# Patient Record
Sex: Male | Born: 1990 | Race: White | Hispanic: No | Marital: Married | Smoking: Never smoker
Health system: Southern US, Community
[De-identification: ages and names within clinical notes are randomized; demographics above are authoritative.]

---

## 2017-02-19 ENCOUNTER — Ambulatory Visit (INDEPENDENT_AMBULATORY_CARE_PROVIDER_SITE_OTHER): Payer: 59

## 2017-02-19 ENCOUNTER — Encounter (HOSPITAL_COMMUNITY): Payer: Self-pay | Admitting: Emergency Medicine

## 2017-02-19 ENCOUNTER — Ambulatory Visit (HOSPITAL_COMMUNITY)
Admission: EM | Admit: 2017-02-19 | Discharge: 2017-02-19 | Disposition: A | Discharge status: Home / Self Care | Payer: 59 | Attending: Family Medicine | Admitting: Family Medicine

## 2017-02-19 DIAGNOSIS — S9001XA Contusion of right ankle, initial encounter: Secondary | ICD-10-CM | POA: Diagnosis not present

## 2017-02-19 DIAGNOSIS — M25571 Pain in right ankle and joints of right foot: Secondary | ICD-10-CM | POA: Diagnosis not present

## 2017-02-19 MED ORDER — HYDROCODONE-ACETAMINOPHEN 5-325 MG PO TABS: 1.0000 | ORAL_TABLET | Freq: Four times a day (QID) | ORAL | 0 refills | Status: AC | PRN

## 2017-02-19 NOTE — ED Triage Notes (Signed)
The patient presented to the [facility] with a complaint of a right foot injury. The patient stated that a large log rolled onto his foot earlier today.

## 2017-02-19 NOTE — ED Provider Notes (Signed)
[facility]    CSN: [id number] Arrival date & time: 02/19/17  1921     History   Chief Complaint Chief Complaint  Patient presents with  . Foot Pain    HPI Luke Shepherd is a 26 y.o. male.   The patient presented to the [facility] with a complaint of a right lower extremity injury. The patient stated that a large log rolled onto his foot earlier today. The log rolled down an embankment and crushed his ankle, pinning in to the ground and requiring another adult to extricate the right lower extremity.  No ankle or foot pain.  Asian arrived wearing a cam walker.      History reviewed. No pertinent past medical history.  There are no active problems to display for this patient.   History reviewed. No pertinent surgical history.     Home Medications    Prior to Admission medications   Medication Sig Start Date End Date Taking? Authorizing Provider  HYDROcodone-acetaminophen (NORCO) 5-325 MG tablet Take 1 tablet by mouth every 6 (six) hours as needed for moderate pain. 02/19/17   Elvina Sidle, MD    Family History History reviewed. No pertinent family history.  Social History Social History  Substance Use Topics  . Smoking status: Never Smoker  . Smokeless tobacco: Never Used  . Alcohol use No     Allergies   Penicillins   Review of Systems Review of Systems  Musculoskeletal: Positive for gait problem and joint swelling.  All other systems reviewed and are negative.    Physical Exam Triage Vital Signs ED Triage Vitals  Enc Vitals Group     BP      Pulse      Resp      Temp      Temp src      SpO2      Weight      Height      Head Circumference      Peak Flow      Pain Score      Pain Loc      Pain Edu?      Excl. in GC?    No data found.   Updated Vital Signs BP 124/78 (BP Location: Right Arm)   Pulse 85   Temp 98.5 F (36.9 C) (Oral)   Resp 18   SpO2 98%    Physical Exam  Constitutional: He is oriented to person, place,  and time. He appears well-developed and well-nourished.  HENT:  Right Ear: External ear normal.  Left Ear: External ear normal.  Mouth/Throat: Oropharynx is clear and moist.  Eyes: Conjunctivae are normal. Pupils are equal, round, and reactive to light.  Neck: Normal range of motion. Neck supple.  Pulmonary/Chest: Effort normal.  Musculoskeletal:  Swollen, reddened ankle with tenderness both malleoli  Neurological: He is alert and oriented to person, place, and time.  Skin: Skin is warm and dry.  Nursing note and vitals reviewed.    UC Treatments / Results  Labs (all labs ordered are listed, but only abnormal results are displayed) Labs Reviewed - No data to display  EKG  EKG Interpretation None       Radiology No results found.  Procedures Procedures (including critical care time)  Medications Ordered in UC Medications - No data to display   Initial Impression / Assessment and Plan / UC Course  I have reviewed the triage vital signs and the nursing notes.  Pertinent labs & imaging results that  were available during my care of the patient were reviewed by me and considered in my medical decision making (see chart for details).     Final Clinical Impressions(s) / UC Diagnoses   Final diagnoses:  Contusion of right ankle, initial encounter    New Prescriptions New Prescriptions   HYDROCODONE-ACETAMINOPHEN (NORCO) 5-325 MG TABLET    Take 1 tablet by mouth every 6 (six) hours as needed for moderate pain.     Elvina Sidle, MD 02/19/17 2041

## 2017-02-19 NOTE — Discharge Instructions (Signed)
Continue using the Cam Walker brace that you wore to the clinic.

## 2017-11-14 DIAGNOSIS — W312XXA Contact with powered woodworking and forming machines, initial encounter: Secondary | ICD-10-CM | POA: Diagnosis not present

## 2017-11-14 DIAGNOSIS — S61310A Laceration without foreign body of right index finger with damage to nail, initial encounter: Secondary | ICD-10-CM | POA: Diagnosis not present

## 2017-11-14 DIAGNOSIS — Z23 Encounter for immunization: Secondary | ICD-10-CM | POA: Diagnosis not present

## 2017-11-14 DIAGNOSIS — S61213A Laceration without foreign body of left middle finger without damage to nail, initial encounter: Secondary | ICD-10-CM | POA: Diagnosis not present

## 2017-11-14 DIAGNOSIS — Z880 Allergy status to penicillin: Secondary | ICD-10-CM | POA: Diagnosis not present

## 2017-11-14 DIAGNOSIS — Y92009 Unspecified place in unspecified non-institutional (private) residence as the place of occurrence of the external cause: Secondary | ICD-10-CM | POA: Diagnosis not present

## 2018-03-25 IMAGING — DX DG ANKLE COMPLETE 3+V*R*
3 series · 3 of 3 positions shown · non-contrast
Comparison: None.

CLINICAL DATA: Question injury to right ankle. Right ankle pain and
bruising. Initial encounter.

EXAM:
RIGHT ANKLE - COMPLETE 3+ VIEW

[ankle ap]
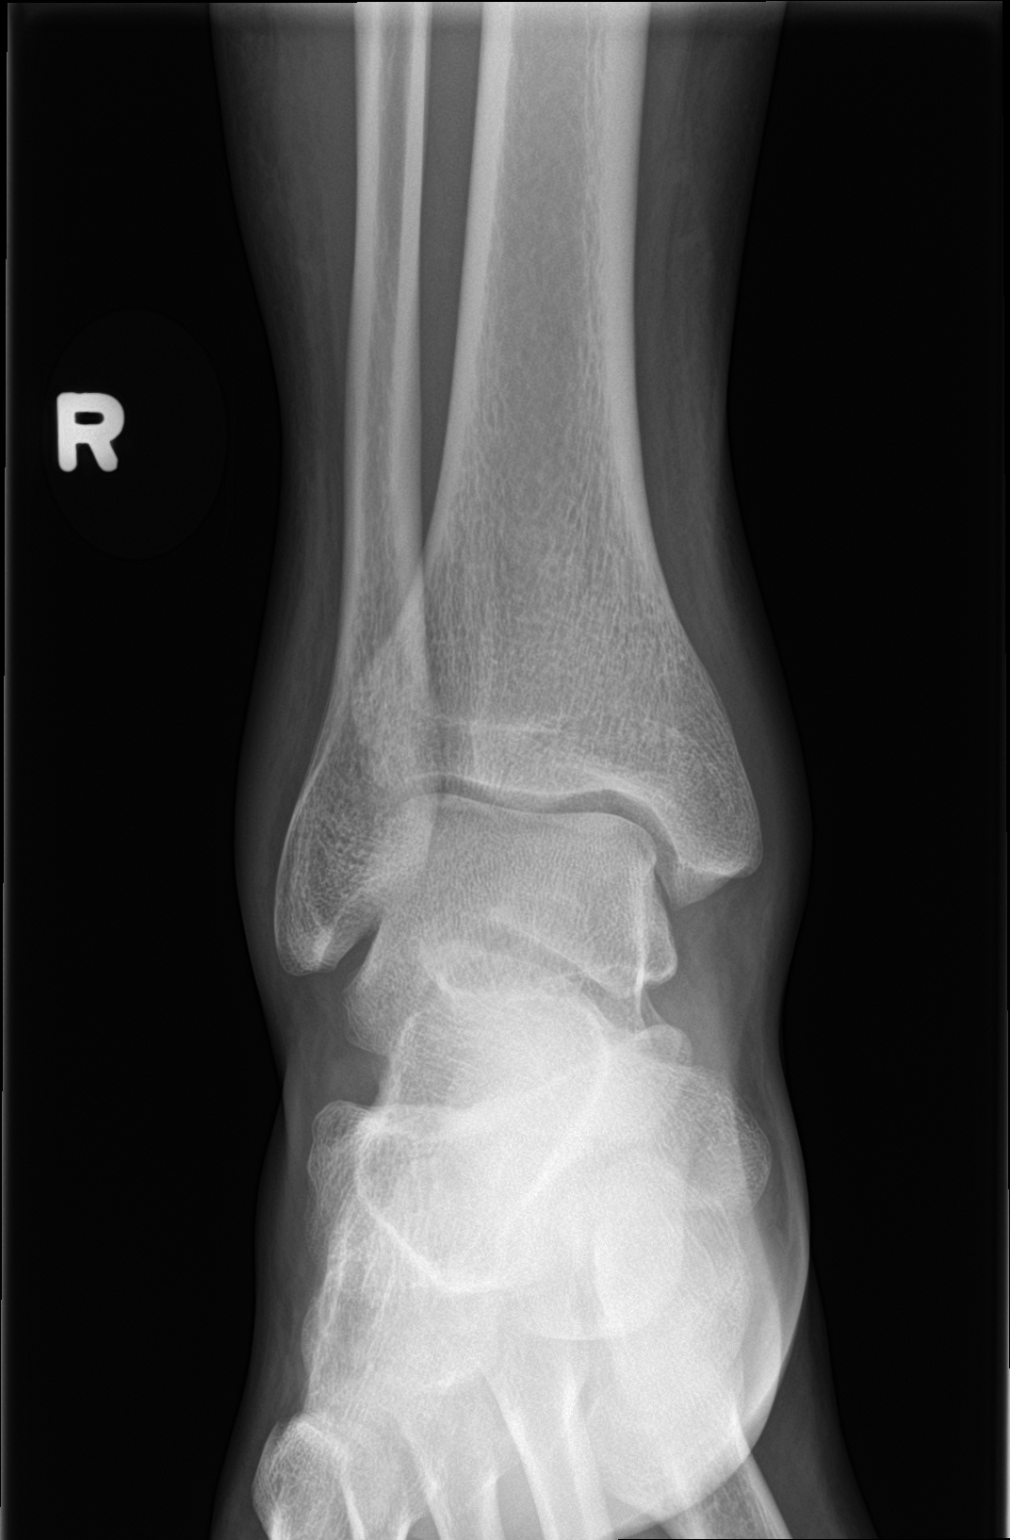

[ankle obl]
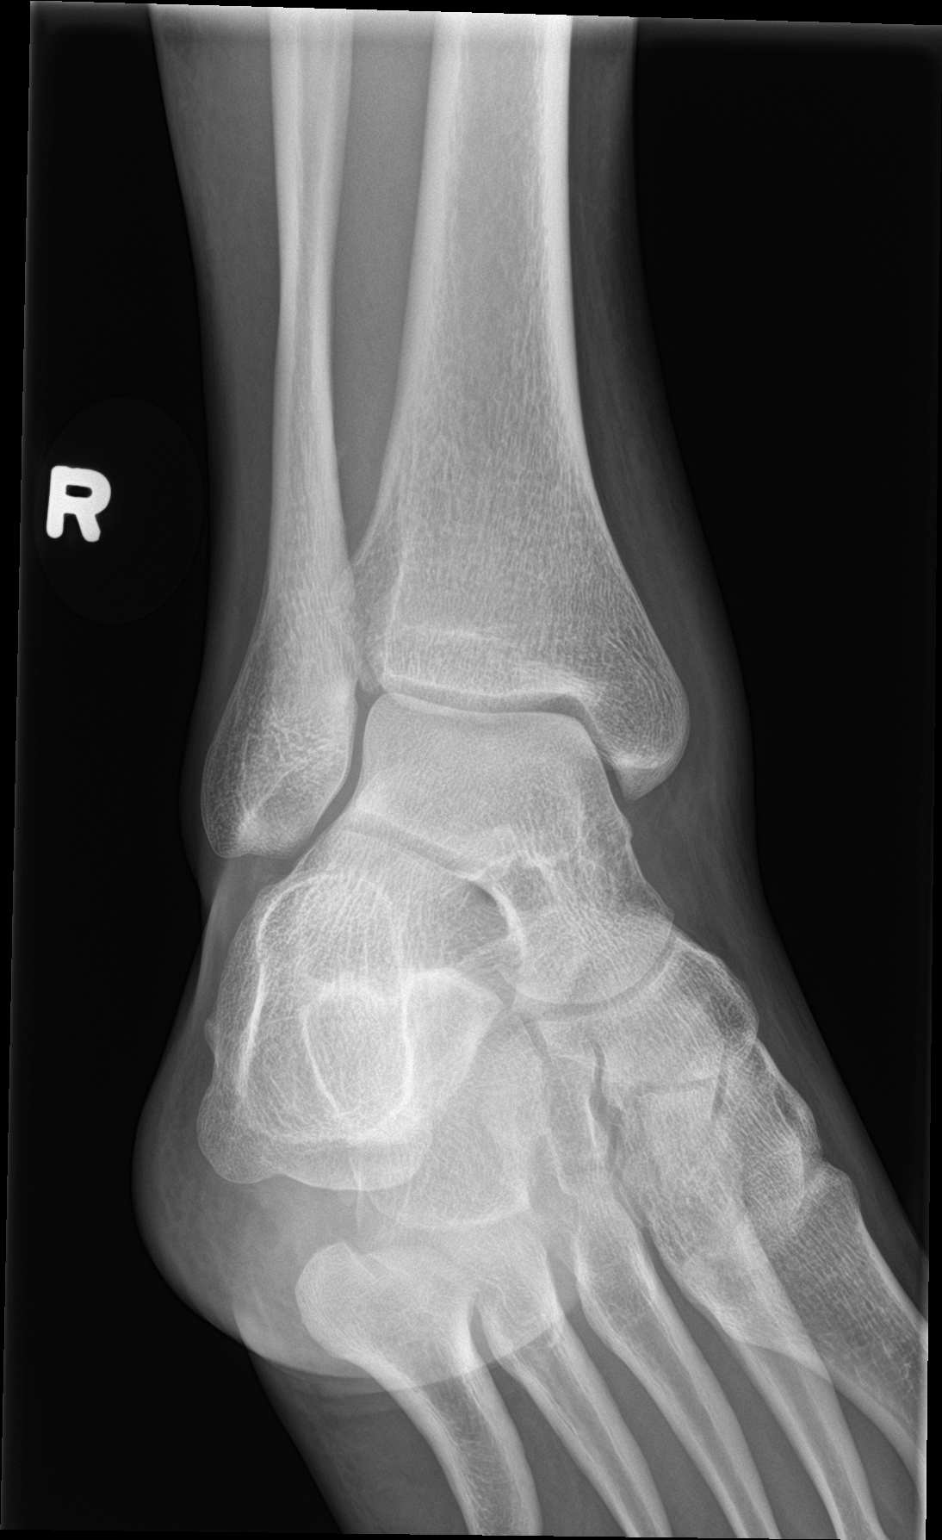

[ankle lat]
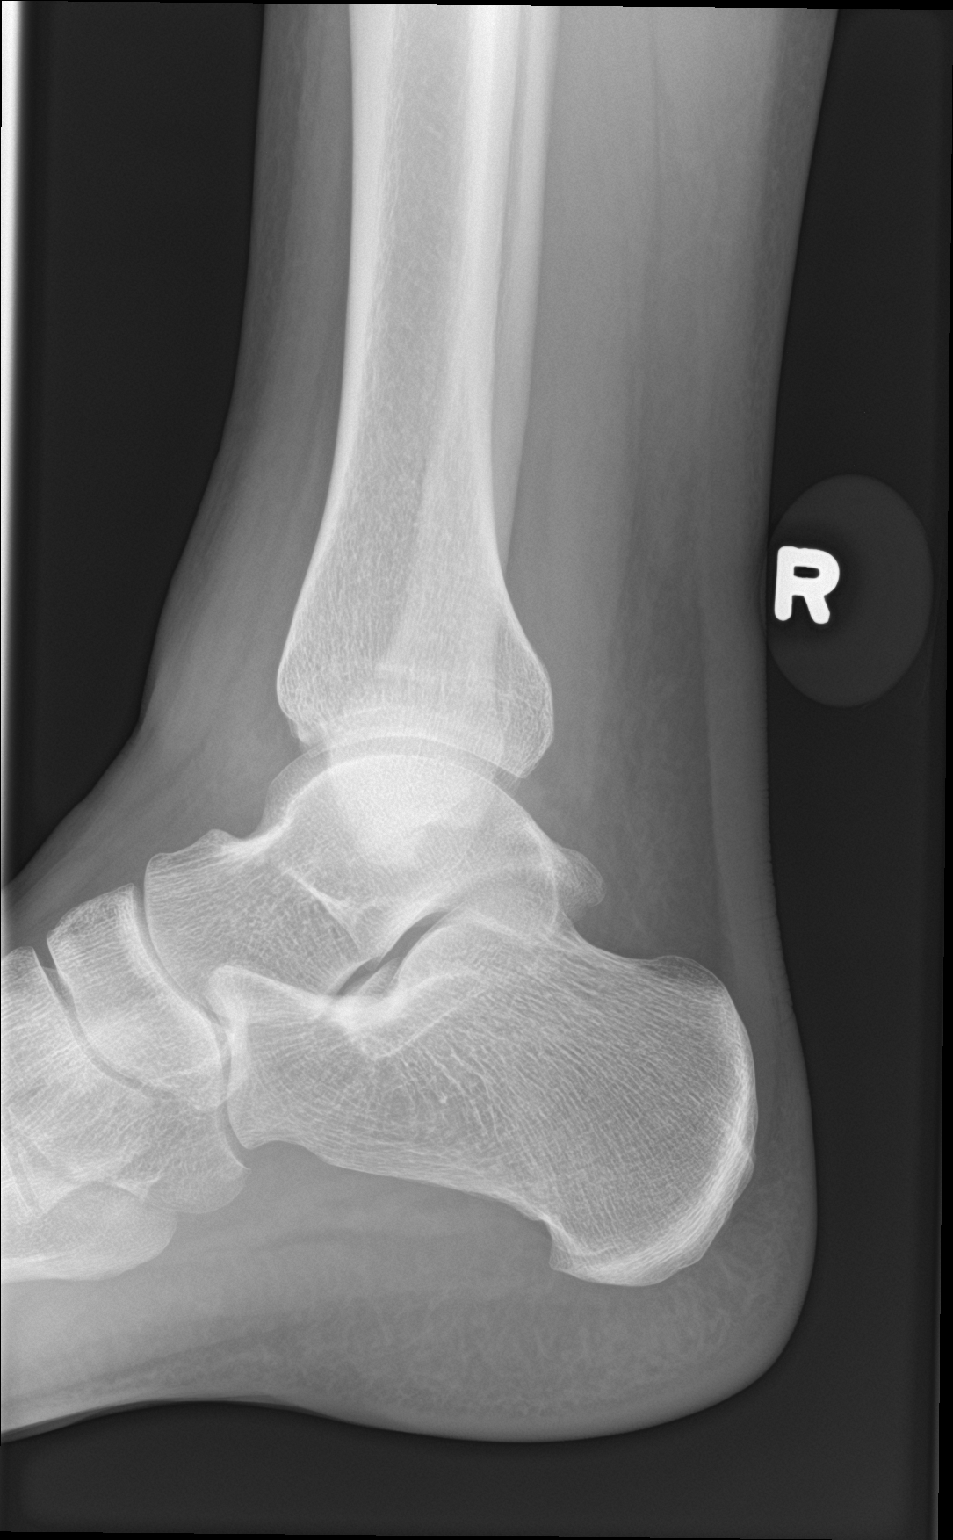

[3 of 3 positions shown; findings below may reference images not displayed]

FINDINGS: There is no evidence of fracture, dislocation, or joint effusion.
There is no evidence of arthropathy or other focal bone abnormality.
Mild diffuse soft tissue swelling seen.
IMPRESSION: Diffuse soft tissue swelling. No evidence of fracture or
dislocation.

## 2018-07-18 DIAGNOSIS — Z0000 Encounter for general adult medical examination without abnormal findings: Secondary | ICD-10-CM | POA: Diagnosis not present

## 2018-07-18 DIAGNOSIS — L210 Seborrhea capitis: Secondary | ICD-10-CM | POA: Diagnosis not present
